# Patient Record
Sex: Female | Born: 1975 | Hispanic: No | Marital: Married | Smoking: Never smoker
Health system: Southern US, Community
[De-identification: ages and names within clinical notes are randomized; demographics above are authoritative.]

---

## 2000-06-17 ENCOUNTER — Emergency Department (HOSPITAL_COMMUNITY): Admission: EM | Admit: 2000-06-17 | Discharge: 2000-06-18 | Payer: Self-pay | Admitting: Emergency Medicine

## 2000-06-18 ENCOUNTER — Encounter: Payer: Self-pay | Admitting: Emergency Medicine

## 2002-07-08 ENCOUNTER — Ambulatory Visit (HOSPITAL_COMMUNITY): Admission: RE | Admit: 2002-07-08 | Discharge: 2002-07-08 | Payer: Self-pay | Admitting: *Deleted

## 2002-09-01 ENCOUNTER — Ambulatory Visit (HOSPITAL_COMMUNITY): Admission: RE | Admit: 2002-09-01 | Discharge: 2002-09-01 | Payer: Self-pay | Admitting: *Deleted

## 2002-11-30 ENCOUNTER — Ambulatory Visit (HOSPITAL_COMMUNITY): Admission: RE | Admit: 2002-11-30 | Discharge: 2002-11-30 | Payer: Self-pay | Admitting: *Deleted

## 2003-01-03 ENCOUNTER — Inpatient Hospital Stay (HOSPITAL_COMMUNITY): Admission: AD | Admit: 2003-01-03 | Discharge: 2003-01-06 | Payer: Self-pay | Admitting: *Deleted

## 2011-07-02 ENCOUNTER — Emergency Department (HOSPITAL_COMMUNITY): Payer: Self-pay

## 2011-07-02 ENCOUNTER — Encounter (HOSPITAL_COMMUNITY): Payer: Self-pay | Admitting: Emergency Medicine

## 2011-07-02 ENCOUNTER — Emergency Department (HOSPITAL_COMMUNITY)
Admission: EM | Admit: 2011-07-02 | Discharge: 2011-07-02 | Disposition: A | Discharge status: Home / Self Care | Payer: Self-pay | Attending: Emergency Medicine | Admitting: Emergency Medicine

## 2011-07-02 DIAGNOSIS — R10811 Right upper quadrant abdominal tenderness: Secondary | ICD-10-CM | POA: Insufficient documentation

## 2011-07-02 DIAGNOSIS — R112 Nausea with vomiting, unspecified: Secondary | ICD-10-CM

## 2011-07-02 DIAGNOSIS — N949 Unspecified condition associated with female genital organs and menstrual cycle: Secondary | ICD-10-CM | POA: Insufficient documentation

## 2011-07-02 DIAGNOSIS — R109 Unspecified abdominal pain: Secondary | ICD-10-CM | POA: Insufficient documentation

## 2011-07-02 DIAGNOSIS — Z331 Pregnant state, incidental: Secondary | ICD-10-CM | POA: Insufficient documentation

## 2011-07-02 DIAGNOSIS — O219 Vomiting of pregnancy, unspecified: Secondary | ICD-10-CM | POA: Insufficient documentation

## 2011-07-02 LAB — COMPREHENSIVE METABOLIC PANEL
Albumin: 3.8 g/dL (ref 3.5–5.2)
BUN: 5 mg/dL — ABNORMAL LOW (ref 6–23)
Calcium: 9.4 mg/dL (ref 8.4–10.5)
GFR calc Af Amer: >90 mL/min (ref >90)
Glucose, Bld: 85 mg/dL (ref 70–99)
Sodium: 130 mEq/L — ABNORMAL LOW (ref 135–145)
Total Protein: 8 g/dL (ref 6.0–8.3)

## 2011-07-02 LAB — URINALYSIS, ROUTINE W REFLEX MICROSCOPIC
Bilirubin Urine: NEGATIVE
Hgb urine dipstick: NEGATIVE
Nitrite: NEGATIVE
Protein, ur: NEGATIVE mg/dL
Specific Gravity, Urine: 1.008 (ref 1.005–1.030)
Urobilinogen, UA: 0.2 mg/dL (ref 0.0–1.0)

## 2011-07-02 LAB — CBC
Hemoglobin: 12.2 g/dL (ref 12.0–15.0)
MCH: 30.6 pg (ref 26.0–34.0)
MCHC: 34.8 g/dL (ref 30.0–36.0)
Platelets: 361 10*3/uL (ref 150–400)
RDW: 11.6 % (ref 11.5–15.5)

## 2011-07-02 LAB — DIFFERENTIAL
Basophils Absolute: 0.1 10*3/uL (ref 0.0–0.1)
Basophils Relative: 0 % (ref 0–1)
Eosinophils Absolute: 0.8 10*3/uL — ABNORMAL HIGH (ref 0.0–0.7)
Monocytes Relative: 5 % (ref 3–12)
Neutro Abs: 7 10*3/uL (ref 1.7–7.7)
Neutrophils Relative %: 61 % (ref 43–77)

## 2011-07-02 LAB — LIPASE, BLOOD: Lipase: 30 U/L (ref 11–59)

## 2011-07-02 LAB — WET PREP, GENITAL
Trich, Wet Prep: NONE SEEN
Yeast Wet Prep HPF POC: NONE SEEN

## 2011-07-02 MED ORDER — SODIUM CHLORIDE 0.9 % IV BOLUS (SEPSIS)
1000.0000 mL | Freq: Once | INTRAVENOUS | Status: AC
  Administered 2011-07-02: 1000 mL via INTRAVENOUS

## 2011-07-02 MED ORDER — ONDANSETRON HCL 4 MG/2ML IJ SOLN
4.0000 mg | Freq: Once | INTRAMUSCULAR | Status: AC
  Administered 2011-07-02: 4 mg via INTRAVENOUS
  Filled 2011-07-02: qty 2

## 2011-07-02 MED ORDER — ONDANSETRON HCL 4 MG PO TABS: 4.0000 mg | ORAL_TABLET | Freq: Four times a day (QID) | ORAL | Status: AC

## 2011-07-02 MED ORDER — PRENATAL RX 60-1 MG PO TABS: 1.0000 | ORAL_TABLET | Freq: Every day | ORAL | Status: AC

## 2011-07-02 MED ORDER — MORPHINE SULFATE 4 MG/ML IJ SOLN
4.0000 mg | Freq: Once | INTRAMUSCULAR | Status: AC
  Administered 2011-07-02: 4 mg via INTRAVENOUS
  Filled 2011-07-02: qty 1

## 2011-07-02 NOTE — ED Notes (Addendum)
Pt states she has been having stomach pain, nausea and diarrhea for the past 3 weeks, but has gotten worse recently.  Pt states that whenever she eats food or drinks water she throws up.  Last episode of emesis was in parking lot.  Has dizziness at times.  Denies diarrhea.

## 2011-07-02 NOTE — ED Provider Notes (Signed)
History     CSN: [id number]  Arrival date & time 07/02/11  1310   First MD Initiated Contact with Patient 07/02/11 1323      Chief Complaint  Patient presents with  . Emesis  . Abdominal Pain    (Consider location/radiation/quality/duration/timing/severity/associated sxs/prior treatment) HPI  36 year old female presenting to the ED with a chief complaint of abdominal pain. Patient states for the past 3 weeks she has been experiencing pain to the right upper quadrant. Pain is described as burning, sharp, worsened after eating. She has been feeling nausea  and has occasional non-bilious, non-bloody vomits. She denies constipation or diarrhea. She states she maintained an appetite but unable to eat. She denies fever, headache, chest pain, shortness of breath, back pain, dysuria, recent trauma, rash. Her last normal bowel movement was this morning. She has tried ibuprofen at home without relief.  History reviewed. No pertinent past medical history.  History reviewed. No pertinent past surgical history.  History reviewed. No pertinent family history.  History  Substance Use Topics  . Smoking status: Never Smoker   . Smokeless tobacco: Not on file  . Alcohol Use: No    OB History    Grav Para Term Preterm Abortions TAB SAB Ect Mult Living                  Review of Systems  All other systems reviewed and are negative.    Allergies  Review of patient's allergies indicates no known allergies.  Home Medications  No current outpatient prescriptions on file.  There were no vitals taken for this visit.  Physical Exam  Nursing note and vitals reviewed. Constitutional:       Awake, alert, nontoxic appearance  HENT:  Head: Atraumatic.  Eyes: Right eye exhibits no discharge. Left eye exhibits no discharge.  Neck: Neck supple.  Pulmonary/Chest: Effort normal. She exhibits no tenderness.  Abdominal: Soft. Normal appearance. There is no hepatosplenomegaly. There is  tenderness in the right upper quadrant. There is positive Murphy's sign. There is no rigidity, no rebound, no guarding, no CVA tenderness and no tenderness at McBurney's point. No hernia. Hernia confirmed negative in the ventral area, confirmed negative in the right inguinal area and confirmed negative in the left inguinal area.  Genitourinary: Vagina normal and uterus normal. Pelvic exam was performed with patient supine. There is no rash or tenderness on the right labia. There is no rash or tenderness on the left labia. Cervix exhibits no motion tenderness, no discharge and no friability. Right adnexum displays no mass and no tenderness. Left adnexum displays no mass, no tenderness and no fullness.  Musculoskeletal: She exhibits no tenderness.       Baseline ROM, no obvious new focal weakness  Neurological:       Mental status and motor strength appears baseline for patient and situation  Skin: No rash noted.  Psychiatric: She has a normal mood and affect.    ED Course  Procedures (including critical care time)  Labs Reviewed - No data to display No results found.   No diagnosis found.  Results for orders placed during the hospital encounter of 07/02/11  CBC      Component Value Range   WBC 11.4 (*) 4.0 - 10.5 (K/uL)   RBC 3.99  3.87 - 5.11 (MIL/uL)   Hemoglobin 12.2  12.0 - 15.0 (g/dL)   HCT 40.9 (*) 81.1 - 46.0 (%)   MCV 88.0  78.0 - 100.0 (fL)   MCH 30.6  26.0 - 34.0 (pg)   MCHC 34.8  30.0 - 36.0 (g/dL)   RDW 16.1  09.6 - 04.5 (%)   Platelets 361  150 - 400 (K/uL)  DIFFERENTIAL      Component Value Range   Neutrophils Relative 61  43 - 77 (%)   Neutro Abs 7.0  1.7 - 7.7 (K/uL)   Lymphocytes Relative 27  12 - 46 (%)   Lymphs Abs 3.0  0.7 - 4.0 (K/uL)   Monocytes Relative 5  3 - 12 (%)   Monocytes Absolute 0.6  0.1 - 1.0 (K/uL)   Eosinophils Relative 7 (*) 0 - 5 (%)   Eosinophils Absolute 0.8 (*) 0.0 - 0.7 (K/uL)   Basophils Relative 0  0 - 1 (%)   Basophils Absolute 0.1   0.0 - 0.1 (K/uL)  URINALYSIS, ROUTINE W REFLEX MICROSCOPIC      Component Value Range   Color, Urine YELLOW  YELLOW    APPearance CLEAR  CLEAR    Specific Gravity, Urine 1.008  1.005 - 1.030    pH 6.0  5.0 - 8.0    Glucose, UA NEGATIVE  NEGATIVE (mg/dL)   Hgb urine dipstick NEGATIVE  NEGATIVE    Bilirubin Urine NEGATIVE  NEGATIVE    Ketones, ur 40 (*) NEGATIVE (mg/dL)   Protein, ur NEGATIVE  NEGATIVE (mg/dL)   Urobilinogen, UA 0.2  0.0 - 1.0 (mg/dL)   Nitrite NEGATIVE  NEGATIVE    Leukocytes, UA NEGATIVE  NEGATIVE   COMPREHENSIVE METABOLIC PANEL      Component Value Range   Sodium 130 (*) 135 - 145 (mEq/L)   Potassium 3.9  3.5 - 5.1 (mEq/L)   Chloride 97  96 - 112 (mEq/L)   CO2 22  19 - 32 (mEq/L)   Glucose, Bld 85  70 - 99 (mg/dL)   BUN 5 (*) 6 - 23 (mg/dL)   Creatinine, Ser 4.09 (*) 0.50 - 1.10 (mg/dL)   Calcium 9.4  8.4 - 81.1 (mg/dL)   Total Protein 8.0  6.0 - 8.3 (g/dL)   Albumin 3.8  3.5 - 5.2 (g/dL)   AST 19  0 - 37 (U/L)   ALT 16  0 - 35 (U/L)   Alkaline Phosphatase 50  39 - 117 (U/L)   Total Bilirubin 0.3  0.3 - 1.2 (mg/dL)   GFR calc non Af Amer >90  >90 (mL/min)   GFR calc Af Amer >90  >90 (mL/min)  LIPASE, BLOOD      Component Value Range   Lipase 30  11 - 59 (U/L)  PREGNANCY, URINE      Component Value Range   Preg Test, Ur POSITIVE (*) NEGATIVE    US Abdomen Complete  07/02/2011  *RADIOLOGY REPORT*  Clinical Data:  Abdominal pain.  COMPLETE ABDOMINAL ULTRASOUND  Comparison:  None.  Findings:  Gallbladder:  No gallstones, gallbladder wall thickening, or pericholecystic fluid. Negative sonographic Murphy's sign.  Common bile duct:  Normal.  4.4 mm in diameter.  Liver:  Normal.  IVC:  Normal.  Pancreas:  Normal.  Spleen:  Normal.  4.8 cm in length.  Right Kidney:  Normal.  9.9 cm in length.  Left Kidney:  Normal.  10.0 cm in length.  Abdominal aorta:  Normal.  2.0 cm maximum diameter.  IMPRESSION: Negative abdominal ultrasound.  Original Report Authenticated By:  Gwynn Burly, M.D.    Results for orders placed during the hospital encounter of 07/02/11  CBC      Component  Value Range   WBC 11.4 (*) 4.0 - 10.5 (K/uL)   RBC 3.99  3.87 - 5.11 (MIL/uL)   Hemoglobin 12.2  12.0 - 15.0 (g/dL)   HCT 25.3 (*) 66.4 - 46.0 (%)   MCV 88.0  78.0 - 100.0 (fL)   MCH 30.6  26.0 - 34.0 (pg)   MCHC 34.8  30.0 - 36.0 (g/dL)   RDW 40.3  47.4 - 25.9 (%)   Platelets 361  150 - 400 (K/uL)  DIFFERENTIAL      Component Value Range   Neutrophils Relative 61  43 - 77 (%)   Neutro Abs 7.0  1.7 - 7.7 (K/uL)   Lymphocytes Relative 27  12 - 46 (%)   Lymphs Abs 3.0  0.7 - 4.0 (K/uL)   Monocytes Relative 5  3 - 12 (%)   Monocytes Absolute 0.6  0.1 - 1.0 (K/uL)   Eosinophils Relative 7 (*) 0 - 5 (%)   Eosinophils Absolute 0.8 (*) 0.0 - 0.7 (K/uL)   Basophils Relative 0  0 - 1 (%)   Basophils Absolute 0.1  0.0 - 0.1 (K/uL)  URINALYSIS, ROUTINE W REFLEX MICROSCOPIC      Component Value Range   Color, Urine YELLOW  YELLOW    APPearance CLEAR  CLEAR    Specific Gravity, Urine 1.008  1.005 - 1.030    pH 6.0  5.0 - 8.0    Glucose, UA NEGATIVE  NEGATIVE (mg/dL)   Hgb urine dipstick NEGATIVE  NEGATIVE    Bilirubin Urine NEGATIVE  NEGATIVE    Ketones, ur 40 (*) NEGATIVE (mg/dL)   Protein, ur NEGATIVE  NEGATIVE (mg/dL)   Urobilinogen, UA 0.2  0.0 - 1.0 (mg/dL)   Nitrite NEGATIVE  NEGATIVE    Leukocytes, UA NEGATIVE  NEGATIVE   COMPREHENSIVE METABOLIC PANEL      Component Value Range   Sodium 130 (*) 135 - 145 (mEq/L)   Potassium 3.9  3.5 - 5.1 (mEq/L)   Chloride 97  96 - 112 (mEq/L)   CO2 22  19 - 32 (mEq/L)   Glucose, Bld 85  70 - 99 (mg/dL)   BUN 5 (*) 6 - 23 (mg/dL)   Creatinine, Ser 5.63 (*) 0.50 - 1.10 (mg/dL)   Calcium 9.4  8.4 - 87.5 (mg/dL)   Total Protein 8.0  6.0 - 8.3 (g/dL)   Albumin 3.8  3.5 - 5.2 (g/dL)   AST 19  0 - 37 (U/L)   ALT 16  0 - 35 (U/L)   Alkaline Phosphatase 50  39 - 117 (U/L)   Total Bilirubin 0.3  0.3 - 1.2 (mg/dL)   GFR calc non  Af Amer >90  >90 (mL/min)   GFR calc Af Amer >90  >90 (mL/min)  LIPASE, BLOOD      Component Value Range   Lipase 30  11 - 59 (U/L)  PREGNANCY, URINE      Component Value Range   Preg Test, Ur POSITIVE (*) NEGATIVE   HCG, QUANTITATIVE, PREGNANCY      Component Value Range   hCG, Beta Chain, Quant, [place] [address] (*) <5 (mIU/mL)   US Abdomen Complete  07/02/2011  *RADIOLOGY REPORT*  Clinical Data:  Abdominal pain.  COMPLETE ABDOMINAL ULTRASOUND  Comparison:  None.  Findings:  Gallbladder:  No gallstones, gallbladder wall thickening, or pericholecystic fluid. Negative sonographic Murphy's sign.  Common bile duct:  Normal.  4.4 mm in diameter.  Liver:  Normal.  IVC:  Normal.  Pancreas:  Normal.  Spleen:  Normal.  4.8 cm in length.  Right Kidney:  Normal.  9.9 cm in length.  Left Kidney:  Normal.  10.0 cm in length.  Abdominal aorta:  Normal.  2.0 cm maximum diameter.  IMPRESSION: Negative abdominal ultrasound.  Original Report Authenticated By: Gwynn Burly, M.D.   US Ob Comp Less 14 Wks  07/02/2011  *RADIOLOGY REPORT*  Clinical Data: 36 year old pregnant female with nausea and pelvic pain.  OBSTETRIC <14 WK [place] AND TRANSVAGINAL OB US  Technique:  Both transabdominal and transvaginal ultrasound examinations were performed for complete evaluation of the gestation as well as the maternal uterus, adnexal regions, and pelvic cul-de-sac.  Transvaginal technique was performed to assess early pregnancy.  Comparison:  None  Intrauterine gestational sac:  Visualized/normal in shape. Yolk sac: Visualized Embryo: Visualized Cardiac Activity: Visualized Heart Rate: 150 bpm  CRL: 12   mm  7   w  3   d          [place] EDC: 02/15/2012  Maternal uterus/adnexae: There is no evidence of subchorionic hemorrhage. The ovaries bilaterally are unremarkable except for a 2 cm simple appearing left ovarian cyst. There is no evidence of free fluid or solid adnexal mass.  IMPRESSION: Single living intrauterine gestation with estimated  gestational age of [redacted] weeks 3 days by this ultrasound.  No evidence of subchorionic hemorrhage.  2 cm simple appearing left ovarian cyst.  Original Report Authenticated By: Rosendo Gros, M.D.   US Ob Transvaginal  07/02/2011  *RADIOLOGY REPORT*  Clinical Data: 36 year old pregnant female with nausea and pelvic pain.  OBSTETRIC <14 WK [place] AND TRANSVAGINAL OB US  Technique:  Both transabdominal and transvaginal ultrasound examinations were performed for complete evaluation of the gestation as well as the maternal uterus, adnexal regions, and pelvic cul-de-sac.  Transvaginal technique was performed to assess early pregnancy.  Comparison:  None  Intrauterine gestational sac:  Visualized/normal in shape. Yolk sac: Visualized Embryo: Visualized Cardiac Activity: Visualized Heart Rate: 150 bpm  CRL: 12   mm  7   w  3   d          [place] EDC: 02/15/2012  Maternal uterus/adnexae: There is no evidence of subchorionic hemorrhage. The ovaries bilaterally are unremarkable except for a 2 cm simple appearing left ovarian cyst. There is no evidence of free fluid or solid adnexal mass.  IMPRESSION: Single living intrauterine gestation with estimated gestational age of [redacted] weeks 3 days by this ultrasound.  No evidence of subchorionic hemorrhage.  2 cm simple appearing left ovarian cyst.  Original Report Authenticated By: Rosendo Gros, M.D.      MDM  Patient has right upper quadrant abdominal pain with associated nausea, and vomiting. This symptom is suggestive of biliary etiology. She has no low abnormal pain concerning for GU etiology, or appendicitis. Patient will be given IV fluid for hydration. Abdominal ultrasound to evaluate her gallbladder, and lab work including CBC, CMP, lipase, UA, pregnancy test. Pain medication and antiemetic given.  3:56 PM Patient's last menstrual period was 2 weeks ago. Her pregnancy test is positive today. An abdominal ultrasound reveals no acute finding concerning for biliary symptoms. In the  setting of a positive urine urine pregnancy test and abdominal pain, I will obtain an HCG quant and a transvaginal ultrasound to rule out ectopic pregnancy.  I've discussed with my attending.   5:20 PM Transvaginal ultrasound reveals a single living intrauterine gestations approximately 7 weeks and 3 days.  Pelvic examination is  unremarkable. Patient instructed to stop taking NSAIDs. Pt instruct to f/u with OBGYN.  PT voice understanding.    Fayrene Helper, PA-C 07/02/11 1810

## 2011-07-02 NOTE — ED Provider Notes (Signed)
Medical screening examination/treatment/procedure(s) were conducted as a shared visit with non-physician practitioner(s) and myself.  I personally evaluated the patient during the encounter  After ruq pain work up negative but pregnancy test positive. It was necessary to verify Quant HCG and to verify IUP to rule out abdominal ectopic, which was done by confirming IUP with US/OB.   Shelda Jakes, MD 07/02/11 [phone]

## 2011-07-02 NOTE — ED Notes (Signed)
Patient transported to Ultrasound 

## 2017-04-08 ENCOUNTER — Encounter (INDEPENDENT_AMBULATORY_CARE_PROVIDER_SITE_OTHER): Payer: Self-pay | Admitting: Physician Assistant

## 2017-04-08 ENCOUNTER — Ambulatory Visit (INDEPENDENT_AMBULATORY_CARE_PROVIDER_SITE_OTHER): Payer: Self-pay | Admitting: Physician Assistant

## 2017-04-08 VITALS — BP 119/78 | HR 76 | Temp 97.7°F | Wt 152.8 lb

## 2017-04-08 DIAGNOSIS — R110 Nausea: Secondary | ICD-10-CM

## 2017-04-08 DIAGNOSIS — J029 Acute pharyngitis, unspecified: Secondary | ICD-10-CM

## 2017-04-08 MED ORDER — AMOXICILLIN-POT CLAVULANATE 875-125 MG PO TABS: 1.0000 | ORAL_TABLET | Freq: Two times a day (BID) | ORAL | 0 refills | Status: DC

## 2017-04-08 MED ORDER — ONDANSETRON 4 MG PO TBDP: 4.0000 mg | ORAL_TABLET | Freq: Three times a day (TID) | ORAL | 0 refills | Status: DC | PRN

## 2017-04-08 MED ORDER — NAPROXEN 500 MG PO TABS: 500.0000 mg | ORAL_TABLET | Freq: Two times a day (BID) | ORAL | 0 refills | Status: DC

## 2017-04-08 NOTE — Progress Notes (Signed)
Subjective:  Patient ID: Kristi Suarez, female    DOB: 11/16/1975  Age: 41 y.o. MRN: [id number]  CC: sore throat  HPI Kristi Suarez is a 41 y.o. female with no significant medical history presents with sore throat, frontal headache, nasal congestion, bilateral ear fullness, fever, chills, nausea, and odynophagia x2 weeks. Was exposed to a sick family member. Has taken Tylenol with little to no relief. Does not endorse CP, palpitations, SOB, abdominal pain, vomiting, rash, swelling, or GI/GU sxs.     Outpatient Medications Prior to Visit  Medication Sig Dispense Refill  . ibuprofen (ADVIL,MOTRIN) 200 MG tablet Take 400 mg by mouth every 6 (six) hours as needed. For stomach pain     No facility-administered medications prior to visit.      ROS Review of Systems  Constitutional: Negative for chills, fever and malaise/fatigue.  HENT: Positive for congestion and sore throat. Negative for ear pain.   Eyes: Negative for blurred vision and pain.  Respiratory: Negative for shortness of breath.   Cardiovascular: Negative for chest pain and palpitations.  Gastrointestinal: Negative for abdominal pain and nausea.  Genitourinary: Negative for dysuria and hematuria.  Musculoskeletal: Negative for joint pain and myalgias.  Skin: Negative for rash.  Neurological: Negative for tingling and headaches.  Psychiatric/Behavioral: Negative for depression. The patient is not nervous/anxious.     Objective:  BP 119/78 (BP Location: Left Arm, Patient Position: Sitting, Cuff Size: Normal)   Pulse 76   Temp 97.7 F (36.5 C) (Oral)   Wt 152 lb 12.8 oz (69.3 kg)   LMP 04/02/2017 (Approximate)   SpO2 96%   BP/Weight 04/08/2017 07/02/2011  Systolic BP 119 115  Diastolic BP 78 62  Wt. (Lbs) 152.8 -      Physical Exam  Constitutional: She is oriented to person, place, and time.  Well developed, obese, NAD, polite  HENT:  Head: Normocephalic and atraumatic.  Mild frontal sinus tenderness  to palpation. Oropharynx mildly erythematous with no exudative lesions.  Eyes: No scleral icterus.  Neck: Normal range of motion. Neck supple. No thyromegaly present.  Cardiovascular: Normal rate, regular rhythm and normal heart sounds.  Pulmonary/Chest: Effort normal and breath sounds normal.  Abdominal: Soft. Bowel sounds are normal. There is no tenderness.  Musculoskeletal: She exhibits no edema.  Lymphadenopathy:    She has no cervical adenopathy.  Neurological: She is alert and oriented to person, place, and time. No cranial nerve deficit. Coordination normal.  Skin: Skin is warm and dry. No rash noted. No erythema. No pallor.  Psychiatric: She has a normal mood and affect. Her behavior is normal. Thought content normal.  Vitals reviewed.    Assessment & Plan:   1. Acute pharyngitis, unspecified etiology - Begin Augmentin - Begin Naproxen  2. Nausea without vomiting - Begin Ondansetron   Meds ordered this encounter  Medications  . amoxicillin-clavulanate (AUGMENTIN) 875-125 MG tablet    Sig: Take 1 tablet 2 (two) times daily by mouth.    Dispense:  20 tablet    Refill:  0    Order Specific Question:   Supervising Provider    Answer:   Quentin AngstJEGEDE, OLUGBEMIGA E [id number]]  . naproxen (NAPROSYN) 500 MG tablet    Sig: Take 1 tablet (500 mg total) 2 (two) times daily with a meal by mouth.    Dispense:  30 tablet    Refill:  0    Order Specific Question:   Supervising Provider    Answer:   Quentin AngstJEGEDE, OLUGBEMIGA E [id number]]  .  ondansetron (ZOFRAN-ODT) 4 MG disintegrating tablet    Sig: Take 1 tablet (4 mg total) every 8 (eight) hours as needed by mouth for nausea or vomiting.    Dispense:  20 tablet    Refill:  0    Order Specific Question:   Supervising Provider    Answer:   Quentin Angst [[id number]]    Follow-up: PRN  Loletta Specter PA

## 2017-04-08 NOTE — Patient Instructions (Signed)
   Faringitis (Pharyngitis) La faringitis es el dolor de garganta (faringe). La garganta presenta enrojecimiento, hinchazn y dolor. CUIDADOS EN EL HOGAR  Beba suficiente lquido para mantener la orina clara o de color amarillo plido.  Solo tome los medicamentos que le haya indicado su mdico. ? Si no toma los medicamentos segn las indicaciones podra volver a enfermarse. Finalice la prescripcin completa, aunque comience a sentirse mejor. ? No tome aspirina.  Reposo.  Enjuguese la boca (hacer grgaras) con agua y sal (cucharadita de sal por litro de agua) cada 1 o 2horas. Esto ayudar a aliviar el dolor.  Si no corre riesgo de ahogarse, puede chupar un caramelo duro o pastillas para la garganta.  SOLICITE AYUDA SI:  Tiene bultos grandes y dolorosos al tacto en el cuello.  Tiene una erupcin cutnea.  Cuando tose elimina una expectoracin verde, amarillo amarronado o con sangre.  SOLICITE AYUDA DE INMEDIATO SI:  Presenta rigidez en el cuello.  Babea o no puede tragar lquidos.  Vomita o no puede retener los medicamentos ni los lquidos.  Siente un dolor intenso que no se alivia con medicamentos.  Tiene problemas para respirar (y no debido a la nariz tapada).  ASEGRESE DE QUE:  Comprende estas instrucciones.  Controlar su afeccin.  Recibir ayuda de inmediato si no mejora o si empeora.  Esta informacin no tiene como fin reemplazar el consejo del mdico. Asegrese de hacerle al mdico cualquier pregunta que tenga. Document Released: 08/17/2008 Document Revised: 03/11/2013 Document Reviewed: 01/26/2013 Elsevier Interactive Patient Education  2017 Elsevier Inc.  

## 2017-05-13 ENCOUNTER — Ambulatory Visit (INDEPENDENT_AMBULATORY_CARE_PROVIDER_SITE_OTHER): Payer: Self-pay | Admitting: Physician Assistant

## 2017-07-04 ENCOUNTER — Encounter (INDEPENDENT_AMBULATORY_CARE_PROVIDER_SITE_OTHER): Payer: Self-pay | Admitting: Physician Assistant

## 2017-07-04 ENCOUNTER — Ambulatory Visit (INDEPENDENT_AMBULATORY_CARE_PROVIDER_SITE_OTHER): Payer: Self-pay | Admitting: Physician Assistant

## 2017-07-04 VITALS — BP 113/78 | HR 79 | Temp 98.0°F | Resp 18 | Ht <= 58 in | Wt 156.0 lb

## 2017-07-04 DIAGNOSIS — R51 Headache: Secondary | ICD-10-CM

## 2017-07-04 DIAGNOSIS — R519 Headache, unspecified: Secondary | ICD-10-CM

## 2017-07-04 DIAGNOSIS — H539 Unspecified visual disturbance: Secondary | ICD-10-CM

## 2017-07-04 MED ORDER — SUMATRIPTAN SUCCINATE 6 MG/0.5ML ~~LOC~~ SOLN
6.0000 mg | Freq: Once | SUBCUTANEOUS | Status: AC
  Administered 2017-07-04: 6 mg via SUBCUTANEOUS

## 2017-07-04 MED ORDER — TOPIRAMATE 25 MG PO TABS: 25.0000 mg | ORAL_TABLET | Freq: Every day | ORAL | 2 refills | Status: DC

## 2017-07-04 NOTE — Patient Instructions (Signed)
Dolor de cabeza general sin causa (General Headache Without Cause) El dolor de cabeza es un dolor o malestar que se siente en la zona de la cabeza o del cuello. Hay muchas causas y tipos de dolores de cabeza. En algunos casos, es posible que no se encuentre la causa. CUIDADOS EN EL HOGAR Control del dolor  Tome los medicamentos de venta libre y los recetados solamente como se lo haya indicado el mdico.  Cuando sienta dolor de cabeza acustese en un cuarto oscuro y tranquilo.  Si se lo indican, aplique hielo sobre la cabeza y la zona del cuello: ? Ponga el hielo en una bolsa plstica. ? Coloque una toalla entre la piel y la bolsa de hielo. ? Coloque el hielo durante 20minutos, 2 a 3veces por da.  Utilice una almohadilla trmica o tome una ducha con agua caliente para aplicar calor en la cabeza y la zona del cuello como se lo haya indicado el mdico.  Mantenga las luces tenues si le molesta las luces brillantes o sus dolores de cabeza empeoran. Comida y bebida  Mantenga un horario para las comidas.  Beba menos alcohol.  Consuma menos o deje de tomar cafena. Instrucciones generales  Concurra a todas las visitas de control como se lo haya indicado el mdico. Esto es importante.  Lleve un registro diario para averiguar si ciertas cosas provocan los dolores de cabeza. Por ejemplo, escriba los siguientes datos: ? Lo que usted come y bebe. ? Cunto tiempo duerme. ? Algn cambio en su dieta o en los medicamentos.  Realice actividades relajantes, como recibir masajes.  Disminuya el nivel de estrs.  Sintese con la espalda recta. No contraiga (tensione) los msculos.  No consuma productos que contengan tabaco. Estos incluyen cigarrillos, tabaco para mascar y cigarrillos electrnicos. Si necesita ayuda para dejar de fumar, consulte al mdico.  Haga ejercicios con regularidad tal como se lo indic el mdico.  Duerma lo suficiente. Esto a menudo significa entre 7 y 9horas de  sueo. SOLICITE AYUDA SI:  Los medicamentos no logran aliviar los sntomas.  Tiene un dolor de cabeza que es diferente a los otros dolores de cabeza.  Tiene malestar estomacal (nuseas) o vomita.  Tiene fiebre. SOLICITE AYUDA DE INMEDIATO SI:  El dolor de cabeza empeora.  Sigue vomitando.  Presenta rigidez en el cuello.  Tiene dificultad para ver.  Tiene dificultad para hablar.  Siente dolor en el ojo o en el odo.  Sus msculos estn dbiles, o pierde el control muscular.  Pierde el equilibrio o tiene problemas para caminar.  Siente que se desvanece (pierde el conocimiento) o se desmaya.  Se siente confundido. Esta informacin no tiene como fin reemplazar el consejo del mdico. Asegrese de hacerle al mdico cualquier pregunta que tenga. Document Released: 08/13/2011 Document Revised: 02/09/2015 Document Reviewed: 09/13/2014 Elsevier Interactive Patient Education  2018 [employer] Inc.  

## 2017-07-04 NOTE — Progress Notes (Signed)
Subjective:  Patient ID: Kristi FormBeradith Akhavan, female    DOB: 01/10/1976  Age: 42 y.o. MRN: [id number]  CC: Headache  HPI  Kristi Suarez is a 42 y.o. female with no significant medical history presents with 14 day history of bilateral mastoid pain that radiates superiorly and inferiorly. Also had 5 episodes of temporarily "dark and smoky" vision bilaterally. Has also seen "sparks" in her field of vision. Feels as if the top of her head is "frozen and tender". Does not feel ill but was diagnosed with pharyngitis nearly three months ago. Has not taken anything for relief. Denies any other focal or generalized neurological symptoms, CP, SOB, fever, chills, nausea, vomiting, abdominal pain, rash, or GI/GU sxs.    Outpatient Medications Prior to Visit  Medication Sig Dispense Refill  . ibuprofen (ADVIL,MOTRIN) 200 MG tablet Take 400 mg by mouth every 6 (six) hours as needed. For stomach pain    . naproxen (NAPROSYN) 500 MG tablet Take 1 tablet (500 mg total) 2 (two) times daily with a meal by mouth. 30 tablet 0  . ondansetron (ZOFRAN-ODT) 4 MG disintegrating tablet Take 1 tablet (4 mg total) every 8 (eight) hours as needed by mouth for nausea or vomiting. 20 tablet 0  . amoxicillin-clavulanate (AUGMENTIN) 875-125 MG tablet Take 1 tablet 2 (two) times daily by mouth. 20 tablet 0   No facility-administered medications prior to visit.      ROS Review of Systems  Constitutional: Negative for chills, fever and malaise/fatigue.  Eyes: Positive for blurred vision. Negative for pain.  Respiratory: Negative for shortness of breath.   Cardiovascular: Negative for chest pain and palpitations.  Gastrointestinal: Negative for abdominal pain and nausea.  Genitourinary: Negative for dysuria and hematuria.       Heavy menstrual flow  Musculoskeletal: Negative for joint pain and myalgias.  Skin: Negative for rash.  Neurological: Positive for headaches. Negative for dizziness, tingling, tremors, sensory  change, speech change, focal weakness, seizures and loss of consciousness.  Psychiatric/Behavioral: Negative for depression. The patient is not nervous/anxious.     Objective:  BP 113/78 (BP Location: Left Arm, Patient Position: Sitting, Cuff Size: Large)   Pulse 79   Temp 98 F (36.7 C) (Oral)   Resp 18   Ht 4\' 10"  (1.473 m)   Wt 156 lb (70.8 kg)   LMP 07/01/2017   SpO2 100%   BMI 32.60 kg/m   BP/Weight 07/04/2017 04/08/2017 07/02/2011  Systolic BP 113 119 115  Diastolic BP 78 78 62  Wt. (Lbs) 156 152.8 -  BMI 32.6 - -      Physical Exam  Constitutional: She is oriented to person, place, and time.  Well developed, overweight, NAD, polite  HENT:  Head: Normocephalic and atraumatic.  Mouth/Throat: No oropharyngeal exudate.  Mild mastoid tenderness to palpation bilaterally with a small papulopustular lesion overlying the right mastoid. No generalized erythema, edema, or ecchymosis. No temporal tenderness to palpation.  Eyes: Conjunctivae and EOM are normal. Pupils are equal, round, and reactive to light. No scleral icterus.  Neck: Normal range of motion. Neck supple. No thyromegaly present.  Cardiovascular: Normal rate, regular rhythm and normal heart sounds.  Pulmonary/Chest: Effort normal and breath sounds normal.  Musculoskeletal: She exhibits no edema.  Lymphadenopathy:    She has no cervical adenopathy.  Neurological: She is alert and oriented to person, place, and time. No cranial nerve deficit. Coordination normal.  Skin: Skin is warm and dry. No rash noted. No erythema. No pallor.  No scalp  tenderness to palpation.  Psychiatric: She has a normal mood and affect. Her behavior is normal. Thought content normal.  Vitals reviewed.    Assessment & Plan:    1. Acute intractable headache, unspecified headache type - Mastoidal pain bilaterally  - CT Head Wo Contrast; Future - CBC with Differential - Comprehensive metabolic panel - TSH - Sedimentation Rate -  C-reactive protein - Begin topiramate (TOPAMAX) 25 MG tablet; Take 1 tablet (25 mg total) by mouth daily.  Dispense: 30 tablet; Refill: 2 - Administered SUMAtriptan (IMITREX) injection 6 mg  2. Visual disturbance - CT Head Wo Contrast; Future     Meds ordered this encounter  Medications  . topiramate (TOPAMAX) 25 MG tablet    Sig: Take 1 tablet (25 mg total) by mouth daily.    Dispense:  30 tablet    Refill:  2    Order Specific Question:   Supervising Provider    Answer:   Quentin Angst [id number]  . SUMAtriptan (IMITREX) injection 6 mg    Follow-up: Return in about 2 weeks (around 07/18/2017) for headache.   Loletta Specter PA

## 2017-07-05 ENCOUNTER — Telehealth (INDEPENDENT_AMBULATORY_CARE_PROVIDER_SITE_OTHER): Payer: Self-pay | Admitting: *Deleted

## 2017-07-05 LAB — SEDIMENTATION RATE: Sed Rate: 12 mm/hr (ref 0–32)

## 2017-07-05 LAB — COMPREHENSIVE METABOLIC PANEL
A/G RATIO: 1.6 (ref 1.2–2.2)
ALK PHOS: 54 IU/L (ref 39–117)
ALT: 15 IU/L (ref 0–32)
AST: 19 IU/L (ref 0–40)
Albumin: 4.4 g/dL (ref 3.5–5.5)
BILIRUBIN TOTAL: 0.2 mg/dL (ref 0.0–1.2)
BUN / CREAT RATIO: 11 (ref 9–23)
BUN: 7 mg/dL (ref 6–24)
CHLORIDE: 101 mmol/L (ref 96–106)
CO2: 25 mmol/L (ref 20–29)
Calcium: 9.2 mg/dL (ref 8.7–10.2)
Creatinine, Ser: 0.65 mg/dL (ref 0.57–1.00)
GFR calc non Af Amer: 111 mL/min/{1.73_m2} (ref >59)
GFR, EST AFRICAN AMERICAN: 128 mL/min/{1.73_m2} (ref >59)
GLUCOSE: 104 mg/dL — AB (ref 65–99)
Globulin, Total: 2.8 g/dL (ref 1.5–4.5)
POTASSIUM: 4.2 mmol/L (ref 3.5–5.2)
Sodium: 140 mmol/L (ref 134–144)
TOTAL PROTEIN: 7.2 g/dL (ref 6.0–8.5)

## 2017-07-05 LAB — CBC WITH DIFFERENTIAL/PLATELET
BASOS ABS: 0 10*3/uL (ref 0.0–0.2)
Basos: 0 %
EOS (ABSOLUTE): 0.2 10*3/uL (ref 0.0–0.4)
Eos: 3 %
HEMOGLOBIN: 12 g/dL (ref 11.1–15.9)
Hematocrit: 35.1 % (ref 34.0–46.6)
Immature Grans (Abs): 0 10*3/uL (ref 0.0–0.1)
Immature Granulocytes: 0 %
LYMPHS ABS: 3.2 10*3/uL — AB (ref 0.7–3.1)
Lymphs: 46 %
MCH: 29.7 pg (ref 26.6–33.0)
MCHC: 34.2 g/dL (ref 31.5–35.7)
MCV: 87 fL (ref 79–97)
MONOS ABS: 0.4 10*3/uL (ref 0.1–0.9)
Monocytes: 6 %
NEUTROS ABS: 3 10*3/uL (ref 1.4–7.0)
Neutrophils: 45 %
PLATELETS: 349 10*3/uL (ref 150–379)
RBC: 4.04 x10E6/uL (ref 3.77–5.28)
RDW: 12.9 % (ref 12.3–15.4)
WBC: 6.8 10*3/uL (ref 3.4–10.8)

## 2017-07-05 LAB — C-REACTIVE PROTEIN: CRP: 6.8 mg/L — ABNORMAL HIGH (ref 0.0–4.9)

## 2017-07-05 LAB — TSH: TSH: 2.69 u[IU]/mL (ref 0.450–4.500)

## 2017-07-05 NOTE — Telephone Encounter (Signed)
Medical Assistant used [employer] to contact patient.  Interpreter Name:Lorenzo Interpreter #: [id number] Medical Assistant left message on patient's home and cell voicemail. Voicemail states to give a call back to [place] with [facility] at [phone]. !!!Please inform patient of labs being normal except for elevation is inflammatory labs. Awaiting CT scan!!!

## 2017-07-05 NOTE — Telephone Encounter (Signed)
-----   Message from Loletta Specteroger David Gomez, PA-C sent at 07/05/2017 12:14 PM EST ----- Labs are essentially normal except for slight elevation on an inflammatory marker. Awaiting CT brain.

## 2017-07-09 ENCOUNTER — Encounter (HOSPITAL_COMMUNITY): Payer: Self-pay

## 2017-07-09 ENCOUNTER — Ambulatory Visit (HOSPITAL_COMMUNITY)
Admission: RE | Admit: 2017-07-09 | Discharge: 2017-07-09 | Disposition: A | Discharge status: Home / Self Care | Payer: Self-pay | Source: Ambulatory Visit | Attending: Physician Assistant | Admitting: Physician Assistant

## 2017-07-09 DIAGNOSIS — R519 Headache, unspecified: Secondary | ICD-10-CM

## 2017-07-09 DIAGNOSIS — R51 Headache: Secondary | ICD-10-CM | POA: Insufficient documentation

## 2017-07-09 DIAGNOSIS — H539 Unspecified visual disturbance: Secondary | ICD-10-CM | POA: Insufficient documentation

## 2017-07-10 ENCOUNTER — Telehealth (INDEPENDENT_AMBULATORY_CARE_PROVIDER_SITE_OTHER): Payer: Self-pay | Admitting: *Deleted

## 2017-07-10 NOTE — Telephone Encounter (Signed)
-----   Message from Loletta Specteroger David Gomez, PA-C sent at 07/10/2017 12:39 PM EST ----- CT of the head is normal.

## 2017-07-10 NOTE — Telephone Encounter (Signed)
Medical Assistant used [employer] to contact patient.  Interpreter Name: Jaymes GraffRuben Interpreter #: [id number] Patient was not available, [employer] left patient a voicemail. Voicemail states to give a call back to [place] with [facility] at [phone]. !!!Please let the patient know her CT of the head was normal!!!

## 2017-07-18 ENCOUNTER — Ambulatory Visit (INDEPENDENT_AMBULATORY_CARE_PROVIDER_SITE_OTHER): Payer: Self-pay | Admitting: Physician Assistant

## 2017-07-18 ENCOUNTER — Telehealth (INDEPENDENT_AMBULATORY_CARE_PROVIDER_SITE_OTHER): Payer: Self-pay | Admitting: Physician Assistant

## 2017-07-18 NOTE — Telephone Encounter (Signed)
Pt called to get the CT scan and the lab result please call her back, used translator, please follow up

## 2017-07-21 ENCOUNTER — Other Ambulatory Visit: Payer: Self-pay

## 2017-07-21 ENCOUNTER — Encounter (HOSPITAL_COMMUNITY): Payer: Self-pay

## 2017-07-21 DIAGNOSIS — R202 Paresthesia of skin: Secondary | ICD-10-CM | POA: Insufficient documentation

## 2017-07-21 DIAGNOSIS — Z79899 Other long term (current) drug therapy: Secondary | ICD-10-CM | POA: Insufficient documentation

## 2017-07-21 DIAGNOSIS — M542 Cervicalgia: Secondary | ICD-10-CM | POA: Insufficient documentation

## 2017-07-21 NOTE — ED Triage Notes (Signed)
Pt reports that her head "feels like its asleep" sometimes. She also reports sometimes the tingling moves down her neck and in to her arms as well. She reports that this problem has been going on for months. No vision changes or vomiting. She states that she had a doctors appointment, but they cancelled it. A&Ox4. Ambulatory.

## 2017-07-22 ENCOUNTER — Emergency Department (HOSPITAL_COMMUNITY)
Admission: EM | Admit: 2017-07-22 | Discharge: 2017-07-22 | Disposition: A | Discharge status: Home / Self Care | Payer: Self-pay | Attending: Emergency Medicine | Admitting: Emergency Medicine

## 2017-07-22 DIAGNOSIS — R202 Paresthesia of skin: Secondary | ICD-10-CM

## 2017-07-22 DIAGNOSIS — M542 Cervicalgia: Secondary | ICD-10-CM

## 2017-07-22 MED ORDER — CYCLOBENZAPRINE HCL 10 MG PO TABS: 10.0000 mg | ORAL_TABLET | Freq: Three times a day (TID) | ORAL | 0 refills | Status: DC | PRN

## 2017-07-22 NOTE — Discharge Instructions (Signed)
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  The blood work and the CT scan you had recently were normal (except for mild elevation of a blood test looking for inflammation)  Take two naproxen (Aleve) tablets at a time, twice a day. Apply ice to your neck as needed.

## 2017-07-22 NOTE — ED Provider Notes (Signed)
[facility] Note   CSN: [id number] Arrival date & time: 07/21/17  1912     History   Chief Complaint Chief Complaint  Patient presents with  . Headache  . Numbness    HPI Kristi Suarez is a 42 y.o. female.  The history is provided by the patient.  She comes in with a greater than one month history of a sense that her upper arms and back of her head or going to sleep.  This feeling comes and goes.  It is worse on the left, but present bilaterally.  She has not noticed any weakness.  There is some associated pain in her neck.  She denies any difficulty with bowels or bladder.  She denies nausea or vomiting.  She saw her primary care provider whose drew some blood in the center for a CT scan and gave her an injection.  The injection did help for a brief period, but symptoms recurred.  She denies any recent trauma.  She denies any vision changes or difficulty with speech.  Symptoms do seem to be worse when she moves her head in certain ways.  History reviewed. No pertinent past medical history.  There are no active problems to display for this patient.   History reviewed. No pertinent surgical history.  OB History    No data available       Home Medications    Prior to Admission medications   Medication Sig Start Date End Date Taking? Authorizing Provider  ibuprofen (ADVIL,MOTRIN) 200 MG tablet Take 400 mg by mouth every 6 (six) hours as needed for headache, mild pain or moderate pain.    Yes [provider]  topiramate (TOPAMAX) 25 MG tablet Take 1 tablet (25 mg total) by mouth daily. 07/04/17  Yes Loletta SpecterGomez, Roger Recia Sons, PA-C  naproxen (NAPROSYN) 500 MG tablet Take 1 tablet (500 mg total) 2 (two) times daily with a meal by mouth. Patient not taking: Reported on 07/22/2017 04/08/17   Loletta SpecterGomez, Roger Roe Koffman, PA-C  ondansetron (ZOFRAN-ODT) 4 MG disintegrating tablet Take 1 tablet (4 mg total) every 8 (eight) hours as needed by mouth  for nausea or vomiting. Patient not taking: Reported on 07/22/2017 04/08/17   Loletta SpecterGomez, Roger Kelie Gainey, PA-C    Family History History reviewed. No pertinent family history.  Social History Social History   Tobacco Use  . Smoking status: Never Smoker  . Smokeless tobacco: Never Used  Substance Use Topics  . Alcohol use: No  . Drug use: No     Allergies   Penicillins   Review of Systems Review of Systems  All other systems reviewed and are negative.    Physical Exam Updated Vital Signs BP (!) 154/109 (BP Location: Left Arm)   Pulse (!) 110   Resp 16   Wt 69.1 kg (152 lb 6.4 oz)   LMP 07/01/2017   SpO2 100%   BMI 31.85 kg/m   Physical Exam  Nursing note and vitals reviewed.  42 year old female, resting comfortably and in no acute distress. Vital signs are significant for elevated blood pressure and heart rate. Oxygen saturation is 100%, which is normal. Head is normocephalic and atraumatic. PERRLA, EOMI. Oropharynx is clear. Neck is supple without adenopathy or JVD.  There is mild tenderness over the cervical spine and over the paracervical musculature. Back is nontender and there is no CVA tenderness. Lungs are clear without rales, wheezes, or rhonchi. Chest is nontender. Heart has regular rate and rhythm without  murmur. Abdomen is soft, flat, nontender without masses or hepatosplenomegaly and peristalsis is normoactive. Extremities have no cyanosis or edema, full range of motion is present. Skin is warm and dry without rash. Neurologic: Mental status is normal, cranial nerves are intact, there are no motor or sensory deficits.  ED Treatments / Results   Procedures Procedures   Medications Ordered in ED Medications - No data to display   Initial Impression / Assessment and Plan / ED Course  I have reviewed the triage vital signs and the nursing notes.  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  No objective neurologic findings  currently.  Old records are reviewed, and CT of the head obtained recently was normal, laboratory workup significant only for mild elevation of CRP.  Patient is informed of these results.  At this point, I do not see any indication for additional imaging.  If symptoms are not responding to treatment, MRI of cervical spine might be indicated.  She is discharged with instructions to take naproxen 2 tablets at a time, twice a day.  She is also given a prescription for cyclobenzaprine.  She is to follow-up with PCP in 2weeks.  Return precautions discussed.  Final Clinical Impressions(s) / ED Diagnoses   Final diagnoses:  Paresthesias  Neck pain    ED Discharge Orders        Ordered    cyclobenzaprine (FLEXERIL) 10 MG tablet  3 times daily PRN     07/22/17 0121       Dione Booze, MD 07/22/17 0130

## 2017-08-05 NOTE — Telephone Encounter (Signed)
Please let patient know that CT is normal  and labs are normal and will be rechecked at the next visit, which is noted in translator message.

## 2017-10-14 ENCOUNTER — Ambulatory Visit (INDEPENDENT_AMBULATORY_CARE_PROVIDER_SITE_OTHER): Payer: Self-pay | Admitting: Nurse Practitioner

## 2017-10-14 ENCOUNTER — Encounter (INDEPENDENT_AMBULATORY_CARE_PROVIDER_SITE_OTHER): Payer: Self-pay | Admitting: Nurse Practitioner

## 2017-10-14 ENCOUNTER — Other Ambulatory Visit: Payer: Self-pay

## 2017-10-14 VITALS — BP 105/68 | HR 73 | Temp 97.9°F | Ht <= 58 in | Wt 153.8 lb

## 2017-10-14 DIAGNOSIS — G44209 Tension-type headache, unspecified, not intractable: Secondary | ICD-10-CM

## 2017-10-14 DIAGNOSIS — M25512 Pain in left shoulder: Secondary | ICD-10-CM

## 2017-10-14 DIAGNOSIS — M25511 Pain in right shoulder: Secondary | ICD-10-CM

## 2017-10-14 DIAGNOSIS — F339 Major depressive disorder, recurrent, unspecified: Secondary | ICD-10-CM

## 2017-10-14 MED ORDER — CYCLOBENZAPRINE HCL 10 MG PO TABS: 10.0000 mg | ORAL_TABLET | Freq: Three times a day (TID) | ORAL | 2 refills | Status: AC | PRN

## 2017-10-14 MED ORDER — ESCITALOPRAM OXALATE 20 MG PO TABS: 20.0000 mg | ORAL_TABLET | Freq: Every day | ORAL | 0 refills | Status: AC

## 2017-10-14 NOTE — Progress Notes (Signed)
Assessment & Plan:  Kristi Suarez was seen today for headache and shoulder pain.  Diagnoses and all orders for this visit:  Tension headache -     cyclobenzaprine (FLEXERIL) 10 MG tablet; Take 1 tablet (10 mg total) by mouth 3 (three) times daily as needed for muscle spasms.  Depression, recurrent (HCC) -     escitalopram (LEXAPRO) 20 MG tablet; Take 1 tablet (20 mg total) by mouth daily.  Follow up 6 weeks.   Acute pain of both shoulders -     cyclobenzaprine (FLEXERIL) 10 MG tablet; Take 1 tablet (10 mg total) by mouth 3 (three) times daily as needed for muscle spasms. May continue ibuprofen and alternate with extra strength Tylenol as needed for pain relief.  May alternate with ice and heat application to affected area as needed for pain relief.   Patient has been counseled on age-appropriate routine health concerns for screening and prevention. These are reviewed and up-to-date. Referrals have been placed accordingly. Immunizations are up-to-date or declined.    Subjective:   Chief Complaint  Patient presents with  . Headache  . Shoulder Pain   HPI Kristi Suarez 42 y.o. female presents to office today with complaints of headache and shoulder pain.    Headaches Ongoing for several months.  Location of headache is posterior neck and bilateral shoulders.  She was recently prescribed flexeril during an ED admission on 07-2017. Today she reports significant relief of her symptoms with flexeril. 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 Headaches are likely from tension, stress and musculoskeletal in nature. I will refill flexeril today. She denies any visual disturbances, nausea or vomiting.  Depression Is she endorses a previous history of depression however she cannot recall the medication she has taken in the past but she does endorse significant relief of her depressive symptoms when she was  taking the medication. PHQ 9 score today is elevated and she is agreeable to starting a new SSRI today.  She endorses increased depressive symptoms since the death of her father.  She denies any suicidal or homicidal ideation today.  She denies insomnia but does endorse increased anxiety at times. Depression screen PHQ 2/9 10/14/2017  Decreased Interest 3  Down, Depressed, Hopeless 3  PHQ - 2 Score 6  Altered sleeping 3  Tired, decreased energy 2  Change in appetite 0  Feeling bad or failure about yourself  1  Trouble concentrating 2  Moving slowly or fidgety/restless 0  Suicidal thoughts 0  PHQ-9 Score 14     Review of Systems  Constitutional: Negative for fever, malaise/fatigue and weight loss.  HENT: Negative.  Negative for nosebleeds.   Eyes: Negative.  Negative for blurred vision, double vision and photophobia.  Respiratory: Negative.  Negative for cough and shortness of breath.   Cardiovascular: Negative.  Negative for chest pain, palpitations and leg swelling.  Gastrointestinal: Negative.  Negative for heartburn, nausea and vomiting.  Musculoskeletal: Positive for myalgias and neck pain.  Neurological: Positive for headaches. Negative for dizziness, focal weakness and seizures.  Psychiatric/Behavioral: Positive for depression. Negative for suicidal ideas. The patient is nervous/anxious.        Increased stress    History reviewed. No pertinent past medical history.  History reviewed. No pertinent surgical history.  Family History  Problem Relation Age of Onset  . Diabetes Father     Social History Reviewed with no changes to  be made today.   Outpatient Medications Prior to Visit  Medication Sig Dispense Refill  . ibuprofen (ADVIL,MOTRIN) 200 MG tablet Take 400 mg by mouth every 6 (six) hours as needed for headache, mild pain or moderate pain.     . cyclobenzaprine (FLEXERIL) 10 MG tablet Take 1 tablet (10 mg total) by mouth 3 (three) times daily as needed for muscle  spasms. 30 tablet 0  . topiramate (TOPAMAX) 25 MG tablet Take 1 tablet (25 mg total) by mouth daily. 30 tablet 2   No facility-administered medications prior to visit.     Allergies  Allergen Reactions  . Penicillins Rash    Has patient had a PCN reaction causing immediate rash, facial/tongue/throat swelling, SOB or lightheadedness with hypotension: Yes Has patient had a PCN reaction causing severe rash involving mucus membranes or skin necrosis: No Has patient had a PCN reaction that required hospitalization: No Has patient had a PCN reaction occurring within the last 10 years: No If all of the above answers are "NO", then may proceed with Cephalosporin use.        Objective:    BP 105/68 (BP Location: Left Arm, Patient Position: Sitting, Cuff Size: Normal)   Pulse 73   Temp 97.9 F (36.6 C) (Oral)   Ht  (1.473 m)   Wt 153 lb 12.8 oz (69.8 kg)   LMP 10/04/2017 (Exact Date)   SpO2 98%   BMI 32.14 kg/m  Wt Readings from Last 3 Encounters:  10/14/17 153 lb 12.8 oz (69.8 kg)  07/21/17 152 lb 6.4 oz (69.1 kg)  07/04/17 156 lb (70.8 kg)    Physical Exam  Constitutional: She is oriented to person, place, and time. She appears well-developed and well-nourished. She is cooperative.  HENT:  Head: Normocephalic and atraumatic.  Eyes: EOM are normal.  Neck: Normal range of motion.  Cardiovascular: Normal rate, regular rhythm, normal heart sounds and intact distal pulses. Exam reveals no gallop and no friction rub.  No murmur heard. Pulmonary/Chest: Effort normal and breath sounds normal. No tachypnea. No respiratory distress. She has no decreased breath sounds. She has no wheezes. She has no rhonchi. She has no rales. She exhibits no tenderness.  Abdominal: Soft. Bowel sounds are normal.  Musculoskeletal: Normal range of motion. She exhibits no edema.  There is noticeable increased muscle tension with palpation of bilateral shoulders  Neurological: She is alert and oriented  to person, place, and time. Coordination normal.  Skin: Skin is warm and dry.  Psychiatric: She has a normal mood and affect. Her speech is normal and behavior is normal. Judgment and thought content normal. Cognition and memory are normal.  Nursing note and vitals reviewed.        Patient has been counseled extensively about nutrition and exercise as well as the importance of adherence with medications and regular follow-up. The patient was given clear instructions to go to ER or return to medical center if symptoms don't improve, worsen or new problems develop. The patient verbalized understanding.   Follow-up: Return in about 6 weeks (around 11/25/2017), or if symptoms worsen or fail to improve, for depression.   Claiborne Rigg, FNP-BC [facility] [place], [place] [phone]   10/14/2017, 4:40 PM

## 2017-10-14 NOTE — Patient Instructions (Signed)
Cefalea tensional (Tension Headache) Una cefalea tensional es un dolor o presin que se siente en la frente y los lados de la cabeza. Estas cefaleas pueden durar de 30minutos a varios das. CUIDADOS EN EL HOGAR Control del dolor  Tome los medicamentos de venta libre y los recetados solamente como se lo haya indicado el mdico.  Cuando sienta dolor de cabeza acustese en un cuarto oscuro y tranquilo.  Si se lo indican, aplquese hielo en la zona de la cabeza y el cuello: ? Ponga el hielo en una bolsa plstica. ? Coloque una toalla entre la piel y la bolsa de hielo. ? Coloque el hielo durante 20minutos, 2 a 3veces por da.  Utilice una almohadilla trmica o tome una ducha caliente para aplicar calor en la zona de la cabeza y el cuello segn las indicaciones del mdico. Comida y bebida  Mantenga un horario para las comidas.  No beba mucho alcohol.  No consuma mucha cafena ni deje de consumirla por completo. Instrucciones generales  Concurra a todas las visitas de control como se lo haya indicado el mdico. Esto es importante.  Lleve un registro diario para averiguar si ciertas cosas provocan los dolores de cabeza. Por ejemplo, escriba los siguientes datos: ? Lo que usted come y bebe. ? Cunto tiempo duerme. ? Algn cambio en su dieta o en los medicamentos.  Pruebe recibir un masaje o hacer otras cosas que lo ayuden a relajarse.  Disminuya el nivel de estrs.  Sintese con la espalda recta. No contraiga (tensione) los msculos.  No consuma productos que contengan tabaco. Estos incluyen cigarrillos, tabaco para mascar y cigarrillos electrnicos. Si necesita ayuda para dejar de fumar, consulte al mdico.  Haga ejercicios con regularidad tal como se lo indic el mdico.  Duerma lo suficiente. Es decir, entre 7 y 9horas de sueo. SOLICITE AYUDA SI:  Los medicamentos no logran aliviar los sntomas.  Tiene un dolor de cabeza que es diferente del dolor de cabeza  habitual.  Tiene malestar estomacal (nuseas) o vomita.  Tiene fiebre. SOLICITE AYUDA DE INMEDIATO SI:  La cefalea es muy intensa.  Sigue vomitando.  Presenta rigidez en el cuello.  Tiene dificultad para ver.  Tiene dificultad para hablar.  Siente dolor en el ojo o en el odo.  Sus msculos estn dbiles, o pierde el control muscular.  Pierde el equilibrio o tiene problemas para caminar.  Siente que se desvanece (pierde el conocimiento) o se desmaya.  Se siente confundido. Esta informacin no tiene como fin reemplazar el consejo del mdico. Asegrese de hacerle al mdico cualquier pregunta que tenga. Document Released: 08/13/2011 Document Revised: 02/09/2015 Document Reviewed: 09/13/2014 Elsevier Interactive Patient Education  2018 [employer]  

## 2017-11-25 ENCOUNTER — Ambulatory Visit (INDEPENDENT_AMBULATORY_CARE_PROVIDER_SITE_OTHER): Payer: Self-pay | Admitting: Physician Assistant

## 2020-07-30 ENCOUNTER — Emergency Department (HOSPITAL_COMMUNITY)
Admission: EM | Admit: 2020-07-30 | Discharge: 2020-07-31 | Disposition: A | Discharge status: Home / Self Care | Payer: Self-pay | Attending: Emergency Medicine | Admitting: Emergency Medicine

## 2020-07-30 ENCOUNTER — Other Ambulatory Visit: Payer: Self-pay

## 2020-07-30 ENCOUNTER — Emergency Department (HOSPITAL_COMMUNITY): Payer: Self-pay

## 2020-07-30 DIAGNOSIS — T39395A Adverse effect of other nonsteroidal anti-inflammatory drugs [NSAID], initial encounter: Secondary | ICD-10-CM | POA: Insufficient documentation

## 2020-07-30 DIAGNOSIS — R109 Unspecified abdominal pain: Secondary | ICD-10-CM | POA: Insufficient documentation

## 2020-07-30 DIAGNOSIS — Z20822 Contact with and (suspected) exposure to covid-19: Secondary | ICD-10-CM | POA: Insufficient documentation

## 2020-07-30 DIAGNOSIS — T50905A Adverse effect of unspecified drugs, medicaments and biological substances, initial encounter: Secondary | ICD-10-CM

## 2020-07-30 DIAGNOSIS — I959 Hypotension, unspecified: Secondary | ICD-10-CM | POA: Insufficient documentation

## 2020-07-30 LAB — RAPID URINE DRUG SCREEN, HOSP PERFORMED
Amphetamines: NOT DETECTED
Barbiturates: NOT DETECTED
Benzodiazepines: NOT DETECTED
Cocaine: NOT DETECTED
Opiates: NOT DETECTED
Tetrahydrocannabinol: NOT DETECTED

## 2020-07-30 LAB — CBG MONITORING, ED: Glucose-Capillary: 160 mg/dL — ABNORMAL HIGH (ref 70–99)

## 2020-07-30 LAB — COMPREHENSIVE METABOLIC PANEL
ALT: 23 U/L (ref 0–44)
AST: 31 U/L (ref 15–41)
Albumin: 3.7 g/dL (ref 3.5–5.0)
Alkaline Phosphatase: 59 U/L (ref 38–126)
Anion gap: 14 (ref 5–15)
BUN: 13 mg/dL (ref 6–20)
CO2: 19 mmol/L — ABNORMAL LOW (ref 22–32)
Calcium: 8.9 mg/dL (ref 8.9–10.3)
Chloride: 102 mmol/L (ref 98–111)
Creatinine, Ser: 0.83 mg/dL (ref 0.44–1.00)
GFR, Estimated: >60 mL/min (ref >60)
Glucose, Bld: 152 mg/dL — ABNORMAL HIGH (ref 70–99)
Potassium: 3.5 mmol/L (ref 3.5–5.1)
Sodium: 135 mmol/L (ref 135–145)
Total Bilirubin: 0.5 mg/dL (ref 0.3–1.2)
Total Protein: 7.3 g/dL (ref 6.5–8.1)

## 2020-07-30 LAB — I-STAT BETA HCG BLOOD, ED (MC, WL, AP ONLY): I-stat hCG, quantitative: <5 m[IU]/mL (ref <5)

## 2020-07-30 LAB — CBC WITH DIFFERENTIAL/PLATELET
Abs Immature Granulocytes: 0.06 10*3/uL (ref 0.00–0.07)
Basophils Absolute: 0.1 10*3/uL (ref 0.0–0.1)
Basophils Relative: 0 %
Eosinophils Absolute: 0.2 10*3/uL (ref 0.0–0.5)
Eosinophils Relative: 1 %
HCT: 41.3 % (ref 36.0–46.0)
Hemoglobin: 13.9 g/dL (ref 12.0–15.0)
Immature Granulocytes: 0 %
Lymphocytes Relative: 44 %
Lymphs Abs: 6.1 10*3/uL — ABNORMAL HIGH (ref 0.7–4.0)
MCH: 30.9 pg (ref 26.0–34.0)
MCHC: 33.7 g/dL (ref 30.0–36.0)
MCV: 91.8 fL (ref 80.0–100.0)
Monocytes Absolute: 0.4 10*3/uL (ref 0.1–1.0)
Monocytes Relative: 3 %
Neutro Abs: 7 10*3/uL (ref 1.7–7.7)
Neutrophils Relative %: 52 %
Platelets: 323 10*3/uL (ref 150–400)
RBC: 4.5 MIL/uL (ref 3.87–5.11)
RDW: 11.9 % (ref 11.5–15.5)
WBC: 13.8 10*3/uL — ABNORMAL HIGH (ref 4.0–10.5)
nRBC: 0 % (ref 0.0–0.2)

## 2020-07-30 LAB — URINALYSIS, ROUTINE W REFLEX MICROSCOPIC
Bilirubin Urine: NEGATIVE
Glucose, UA: NEGATIVE mg/dL
Ketones, ur: 5 mg/dL — AB
Leukocytes,Ua: NEGATIVE
Nitrite: NEGATIVE
Protein, ur: 100 mg/dL — AB
Specific Gravity, Urine: 1.015 (ref 1.005–1.030)
pH: 6 (ref 5.0–8.0)

## 2020-07-30 LAB — LIPASE, BLOOD: Lipase: 33 U/L (ref 11–51)

## 2020-07-30 LAB — POC SARS CORONAVIRUS 2 AG -  ED: SARS Coronavirus 2 Ag: NEGATIVE

## 2020-07-30 MED ORDER — DIPHENHYDRAMINE HCL 50 MG/ML IJ SOLN
12.5000 mg | Freq: Once | INTRAMUSCULAR | Status: AC
  Administered 2020-07-30: 12.5 mg via INTRAVENOUS
  Filled 2020-07-30: qty 1

## 2020-07-30 NOTE — ED Notes (Signed)
Pt tolerated water. Pt complaining that she is itching; will alert provider.

## 2020-07-30 NOTE — ED Provider Notes (Signed)
[facility] EMERGENCY DEPARTMENT Provider Note   CSN: [id number] Arrival date & time: 07/30/20  1824     History Chief Complaint  Patient presents with  . Abdominal Pain  . Hypotension    Kristi Suarez is a 45 y.o. female.  History is limited due to patient not speaking with [place].  Attempted to use Spanish iPad interpreter with no success.  EMS states that they were called to the patient's work.  Reportedly she had some body aches and an employee gave her what we think was diclofenac.  About 40 minutes after that she had stomach pain and was rolling around on the ground outside of the work.  They found her blood pressure to be in the 80s and gave her IV fluids.  Was reportedly speaking with them until arrival to the department when she became nonverbal and clenching her eyes closed, not answering questions, shaking.  The history is provided by the EMS personnel.  Altered Mental Status Presenting symptoms: partial responsiveness   Severity:  Unable to specify Most recent episode:  Today Episode history:  Single Timing:  Constant Progression:  Unchanged Chronicity:  New Associated symptoms: abdominal pain        No past medical history on file.  There are no problems to display for this patient.   No past surgical history on file.   OB History   No obstetric history on file.     Family History  Problem Relation Age of Onset  . Diabetes Father     Social History   Tobacco Use  . Smoking status: Never Smoker  . Smokeless tobacco: Never Used  Substance Use Topics  . Alcohol use: No  . Drug use: No    Home Medications Prior to Admission medications   Medication Sig Start Date End Date Taking? Authorizing Provider  cyclobenzaprine (FLEXERIL) 10 MG tablet Take 1 tablet (10 mg total) by mouth 3 (three) times daily as needed for muscle spasms. 10/14/17   Claiborne Rigg, NP  escitalopram (LEXAPRO) 20 MG tablet Take 1 tablet (20 mg total) by  mouth daily. 10/14/17   Claiborne Rigg, NP  ibuprofen (ADVIL,MOTRIN) 200 MG tablet Take 400 mg by mouth every 6 (six) hours as needed for headache, mild pain or moderate pain.     [provider]    Allergies    Penicillins  Review of Systems   Review of Systems  Unable to perform ROS: Mental status change  Gastrointestinal: Positive for abdominal pain.    Physical Exam Updated Vital Signs BP 109/80   Pulse 90   Temp 98.2 F (36.8 C) (Oral)   Resp (!) 22   SpO2 100%   Physical Exam Vitals and nursing note reviewed.  Constitutional:      General: She is not in acute distress.    Appearance: Normal appearance. She is well-developed and well-nourished.  HENT:     Head: Normocephalic and atraumatic.  Eyes:     Conjunctiva/sclera: Conjunctivae normal.  Cardiovascular:     Rate and Rhythm: Normal rate and regular rhythm.     Heart sounds: No murmur heard.   Pulmonary:     Effort: Pulmonary effort is normal. No respiratory distress.     Breath sounds: Normal breath sounds.  Abdominal:     Palpations: Abdomen is soft.     Tenderness: There is no abdominal tenderness. There is no guarding or rebound.  Musculoskeletal:        General: No  deformity, signs of injury or edema. Normal range of motion.     Cervical back: Neck supple.  Skin:    General: Skin is warm and dry.     Capillary Refill: Capillary refill takes less than 2 seconds.  Neurological:     General: No focal deficit present.  Psychiatric:        Mood and Affect: Mood and affect normal.        Behavior: Behavior is uncooperative.     ED Results / Procedures / Treatments   Labs (all labs ordered are listed, but only abnormal results are displayed) Labs Reviewed  COMPREHENSIVE METABOLIC PANEL - Abnormal; Notable for the following components:      Result Value   CO2 19 (*)    Glucose, Bld 152 (*)    All other components within normal limits  CBC WITH DIFFERENTIAL/PLATELET - Abnormal; Notable  for the following components:   WBC 13.8 (*)    Lymphs Abs 6.1 (*)    All other components within normal limits  URINALYSIS, ROUTINE W REFLEX MICROSCOPIC - Abnormal; Notable for the following components:   APPearance CLOUDY (*)    Hgb urine dipstick SMALL (*)    Ketones, ur 5 (*)    Protein, ur 100 (*)    Bacteria, UA RARE (*)    All other components within normal limits  CBG MONITORING, ED - Abnormal; Notable for the following components:   Glucose-Capillary 160 (*)    All other components within normal limits  LIPASE, BLOOD  RAPID URINE DRUG SCREEN, HOSP PERFORMED  I-STAT BETA HCG BLOOD, ED ([facility], [facility], [facility] ONLY)  POC SARS CORONAVIRUS 2 AG -  ED    EKG EKG Interpretation  Date/Time:  Saturday July 30 2020 18:43:55 EST Ventricular Rate:  71 PR Interval:    QRS Duration: 84 QT Interval:  400 QTC Calculation: 435 R Axis:   64 Text Interpretation: Sinus rhythm Low voltage, precordial leads Borderline T abnormalities, anterior leads No old tracing to compare Confirmed by Meridee Score [phone]) on 07/30/2020 6:46:45 PM Also confirmed by Meridee Score [phone]), editor Elita Quick [phone])  on 07/31/2020 7:43:26 AM   Radiology DG Chest Port 1 View  Result Date: 07/30/2020 CLINICAL DATA:  Weakness EXAM: PORTABLE CHEST 1 VIEW COMPARISON:  None. FINDINGS: The heart size and mediastinal contours are within normal limits. Overall shallow degree of aeration. Both lungs are clear. The visualized skeletal structures are unremarkable. IMPRESSION: No active disease. Electronically Signed   By: Jonna Clark M.D.   On: 07/30/2020 19:06    Procedures Procedures   Medications Ordered in ED Medications  diphenhydrAMINE (BENADRYL) injection 12.5 mg (12.5 mg Intravenous Given 07/30/20 2141)    ED Course  I have reviewed the triage vital signs and the nursing notes.  Pertinent labs & imaging results that were available during my care of the patient were reviewed by me and considered in  my medical decision making (see chart for details).  Clinical Course as of 07/31/20 4008  Sat Jul 30, 2020  1854 Reassessed patient, she looks more comfortable although still will not answer questions or open her eyes.  Vitals stable. [MB]  1915 Reassessed patient, still not talking or opening eyes but looks comfortable and vital stable. [MB]  1945 Patient now opens her eyes to voice.  She is able to explain what happened.  She said she had a bad headache at work and somebody gave her a pill.  The medicine made her feel really  bad and crazy feeling.  She said her headache is resolved and currently does not have any complaints. [MB]  2106 States her stomach is feeling better now.  We will put her up for p.o. trial. [MB]    Clinical Course User Index [MB] Terrilee Files, MD   MDM Rules/Calculators/A&P                         Kristi Suarez was evaluated in Emergency Department on 07/30/2020 for the symptoms described in the history of present illness. She was evaluated in the context of the global COVID-19 pandemic, which necessitated consideration that the patient might be at risk for infection with the SARS-CoV-2 virus that causes COVID-19. Institutional protocols and algorithms that pertain to the evaluation of patients at risk for COVID-19 are in a state of rapid change based on information released by regulatory bodies including the CDC and federal and state organizations. These policies and algorithms were followed during the patient's care in the ED.  This patient complains of headache abdominal pain altered mental status; this involves an extensive number of treatment Options and is a complaint that carries with it a high risk of complications and Morbidity. The differential includes allergic reaction, arrhythmia, infection, panic attack, COVID, anxiety  I ordered, reviewed and interpreted labs, which included CBC with mildly elevated white count, chemistries fairly normal other than  low bicarb elevated glucose, urinalysis equivocal for infection, pregnancy test negative, Covid test negative urine tox negative I ordered medication IV Benadryl for some itching I ordered imaging studies which included chest x-ray and I independently    visualized and interpreted imaging which showed no acute findings Additional history obtained from EMS Previous records obtained and reviewed in epic, no recent admissions  After the interventions stated above, I reevaluated the patient and found patient's mental status is returned back to normal.  P.o. trial successfully.  No complaints.  Return instructions discussed   Final Clinical Impression(s) / ED Diagnoses Final diagnoses:  Adverse effect of drug, initial encounter    Rx / DC Orders ED Discharge Orders    None       Terrilee Files, MD 07/31/20 [phone]

## 2020-07-30 NOTE — ED Triage Notes (Signed)
Patient BIB [facility] from work. Called out for stomach pain. Patient on ground upon EMS arrival, complaining of stomach pain. Patient hypotensive with EMS, 80/50.

## 2020-07-30 NOTE — Discharge Instructions (Signed)
You were seen in the emergency department for a reaction to a medication that you took. You were observed for period of hours with improvement in your symptoms. There were no significant lab abnormalities. Please drink plenty of fluids and get some rest. Return to the emergency department if any worsening or concerning symptoms

## 2020-07-30 NOTE — ED Notes (Signed)
Pt will not cooperate with exam.

## 2022-07-14 IMAGING — DX DG CHEST 1V PORT
1 series · 1 of 1 positions shown · non-contrast
Comparison: None.

CLINICAL DATA: Weakness

EXAM:
PORTABLE CHEST 1 VIEW

[chest ap]
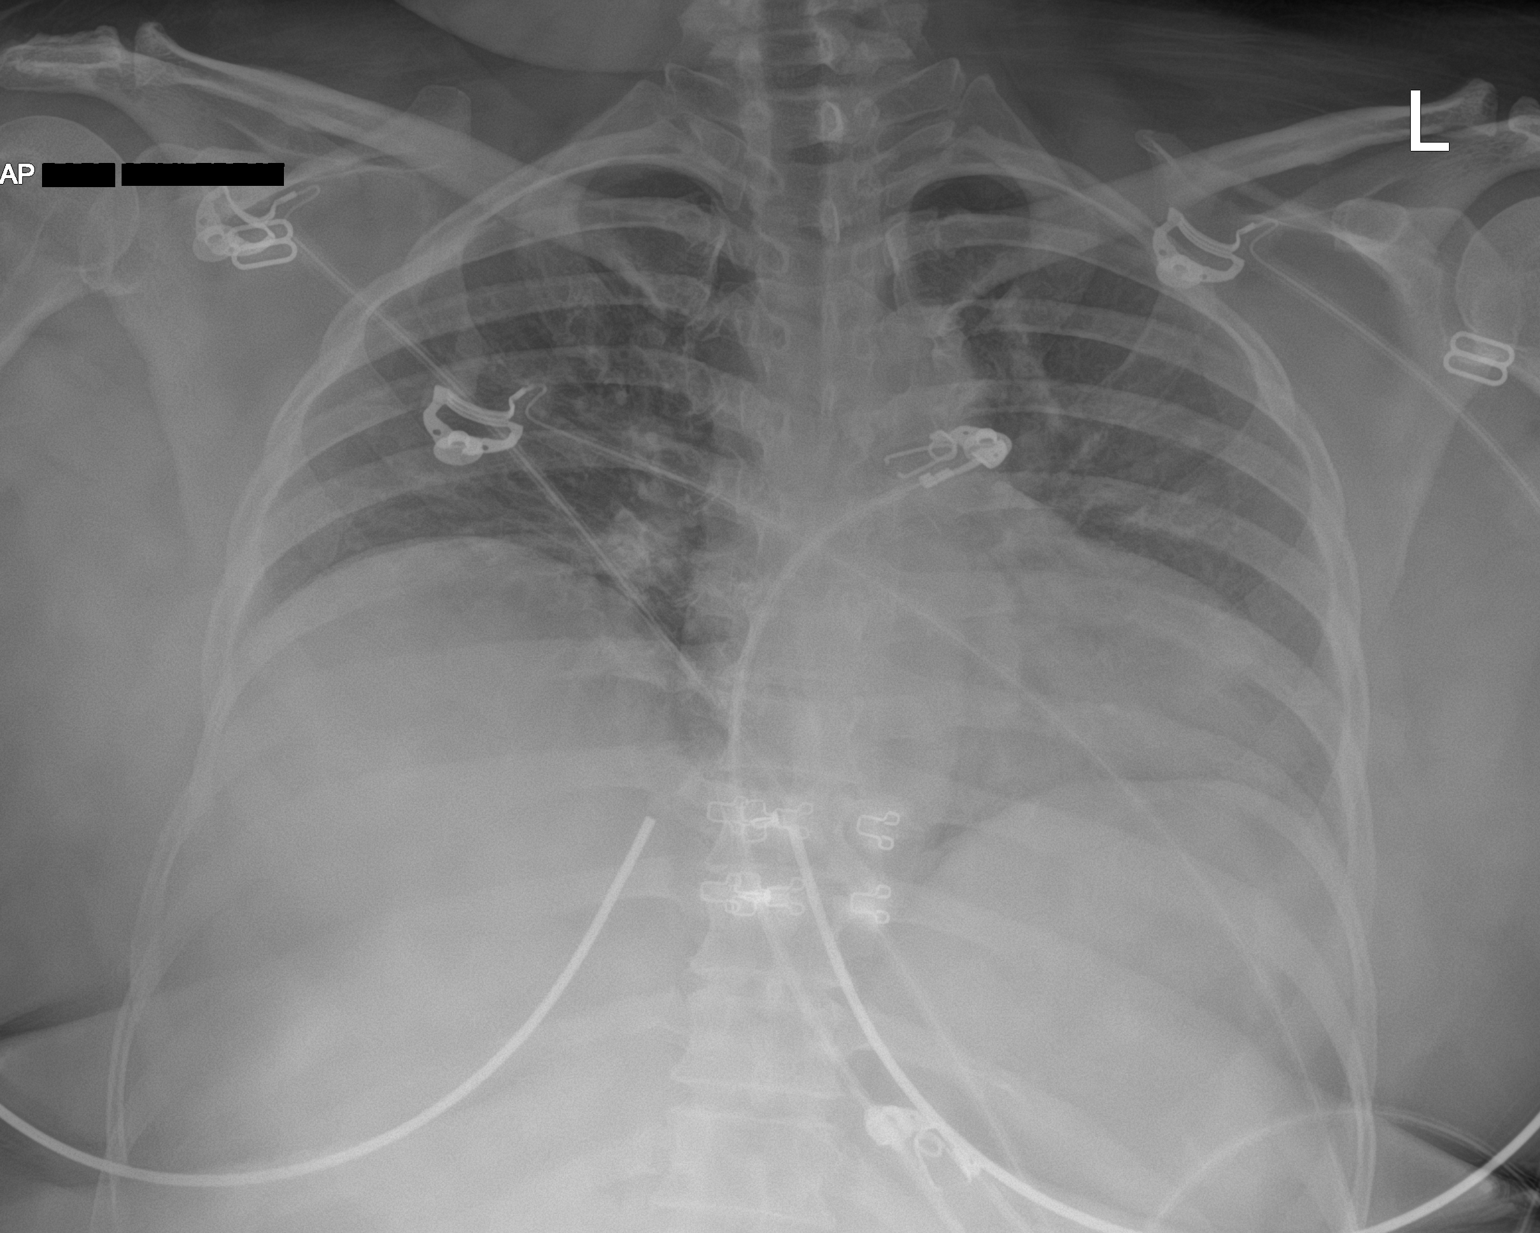

[1 of 1 positions shown; findings below may reference images not displayed]

FINDINGS: The heart size and mediastinal contours are within normal limits.
Overall shallow degree of aeration. Both lungs are clear. The
visualized skeletal structures are unremarkable.
IMPRESSION: No active disease.
